# Patient Record
Sex: Male | Born: 1950 | Race: White | Hispanic: No | Smoking: Never smoker
Health system: Southern US, Community
[De-identification: ages and names within clinical notes are randomized; demographics above are authoritative.]

## PROBLEM LIST (undated history)

## (undated) HISTORY — PX: BACK SURGERY: SHX140

---

## 2018-09-22 ENCOUNTER — Emergency Department (HOSPITAL_BASED_OUTPATIENT_CLINIC_OR_DEPARTMENT_OTHER)
Admission: EM | Admit: 2018-09-22 | Discharge: 2018-09-22 | Disposition: A | Discharge status: Home / Self Care | Payer: BLUE CROSS/BLUE SHIELD | Attending: Emergency Medicine | Admitting: Emergency Medicine

## 2018-09-22 ENCOUNTER — Other Ambulatory Visit: Payer: Self-pay

## 2018-09-22 ENCOUNTER — Emergency Department (HOSPITAL_BASED_OUTPATIENT_CLINIC_OR_DEPARTMENT_OTHER): Payer: BLUE CROSS/BLUE SHIELD

## 2018-09-22 ENCOUNTER — Encounter (HOSPITAL_BASED_OUTPATIENT_CLINIC_OR_DEPARTMENT_OTHER): Payer: Self-pay | Admitting: Emergency Medicine

## 2018-09-22 DIAGNOSIS — S70351A Superficial foreign body, right thigh, initial encounter: Secondary | ICD-10-CM | POA: Diagnosis not present

## 2018-09-22 DIAGNOSIS — Y929 Unspecified place or not applicable: Secondary | ICD-10-CM | POA: Diagnosis not present

## 2018-09-22 DIAGNOSIS — Z5321 Procedure and treatment not carried out due to patient leaving prior to being seen by health care provider: Secondary | ICD-10-CM | POA: Diagnosis not present

## 2018-09-22 DIAGNOSIS — X58XXXA Exposure to other specified factors, initial encounter: Secondary | ICD-10-CM | POA: Insufficient documentation

## 2018-09-22 DIAGNOSIS — Y998 Other external cause status: Secondary | ICD-10-CM | POA: Diagnosis not present

## 2018-09-22 DIAGNOSIS — Y9389 Activity, other specified: Secondary | ICD-10-CM | POA: Diagnosis not present

## 2018-09-22 NOTE — ED Triage Notes (Signed)
Pt states a needle broke off in his R thigh today while giving himself his testosterone shot.

## 2018-09-22 NOTE — ED Notes (Signed)
Pt states he cannot stay any longer and is leaving the premises.

## 2019-05-14 IMAGING — DX DG FEMUR 2+V*R*
4 series · 5 of 5 positions shown · non-contrast
Comparison: None.

CLINICAL DATA: Right lower leg pain. Assess for foreign
body/needle.

EXAM:
RIGHT FEMUR 2 VIEWS

[femur ap (1 of 2)]
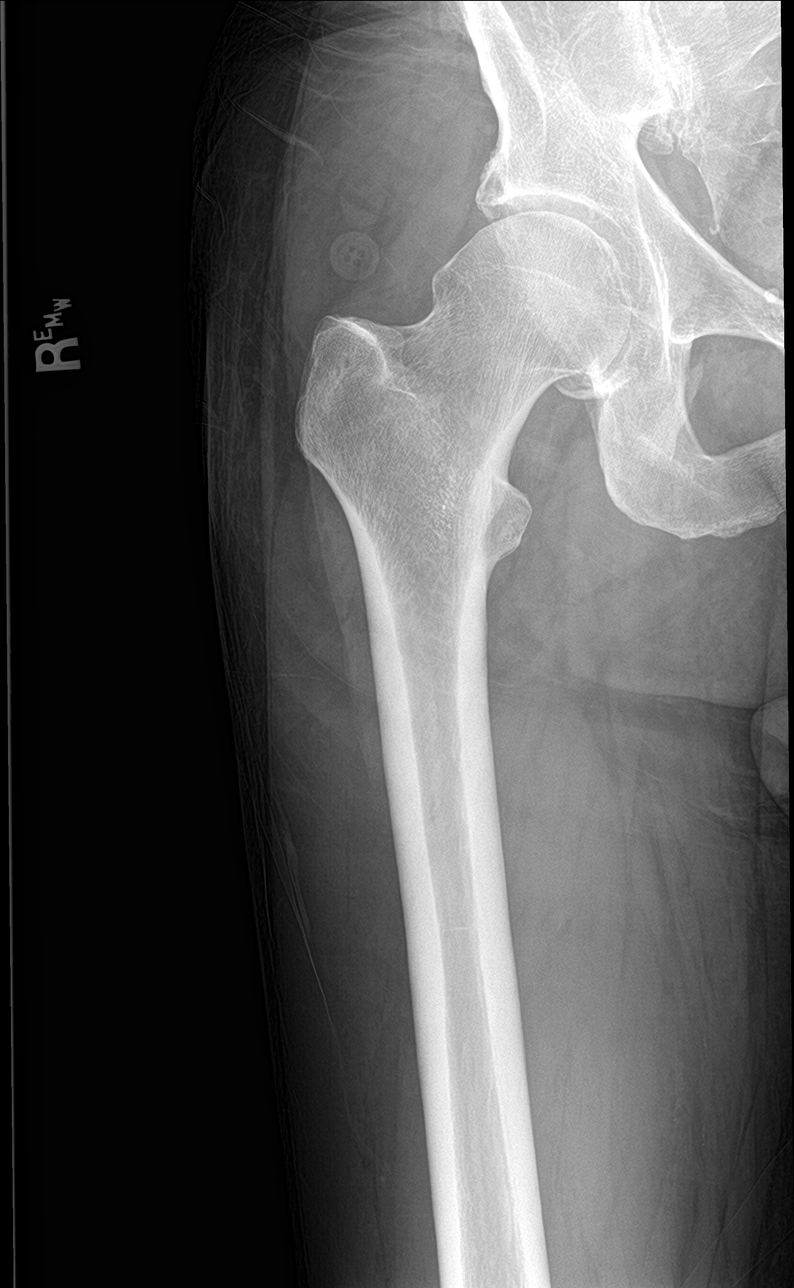

[femur ap (2 of 2)]
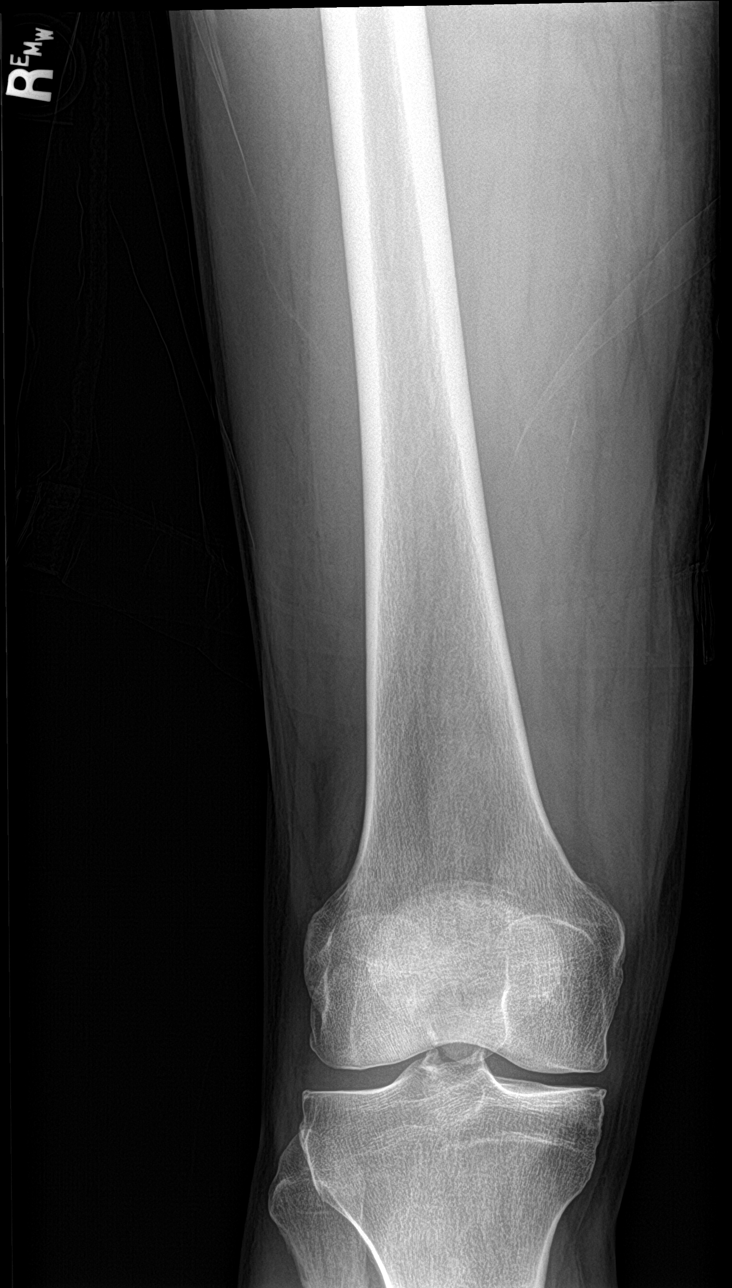

[Series 3: femur lat · 0.14mm/px · 2 of 2 slices shown (1 of 2)]
[im 1/2]
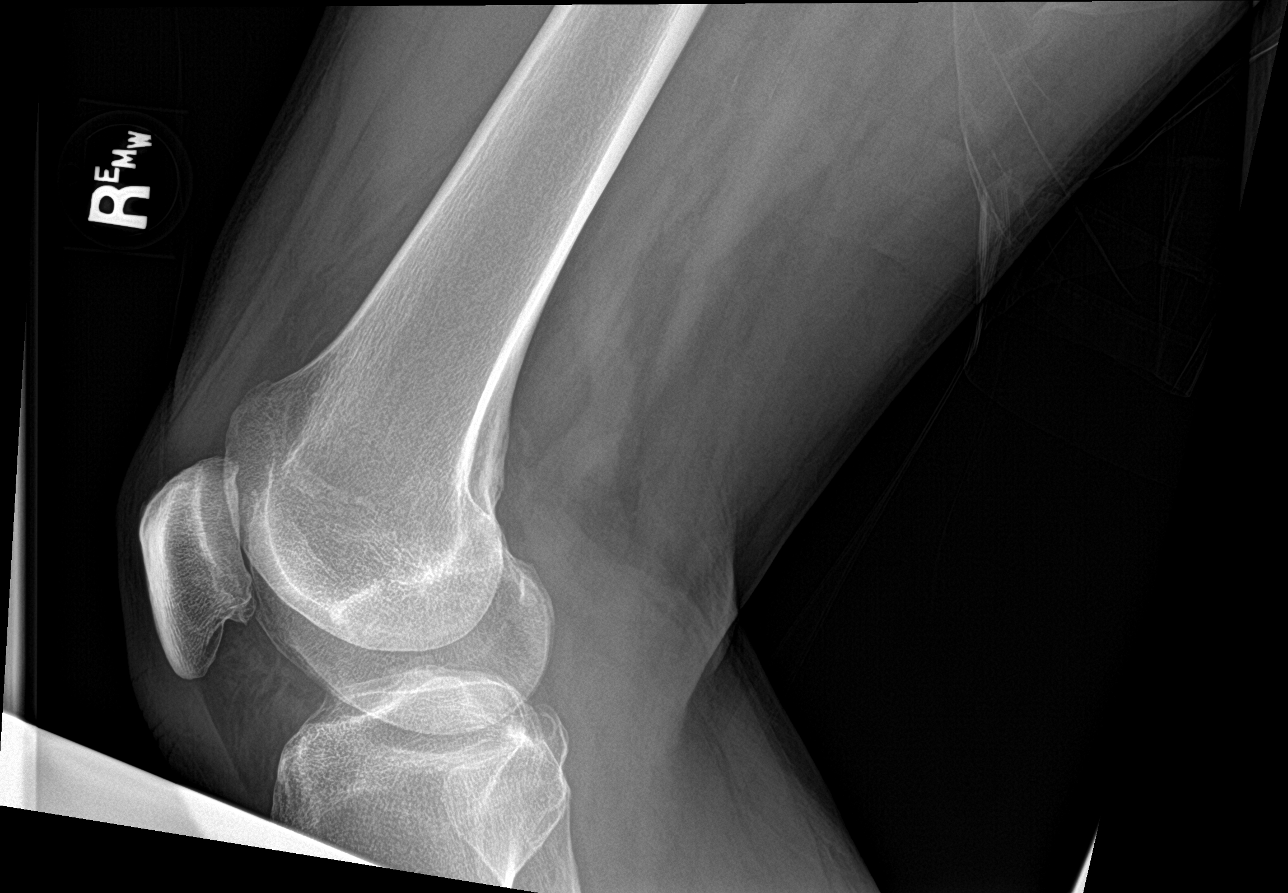
[im 2/2]
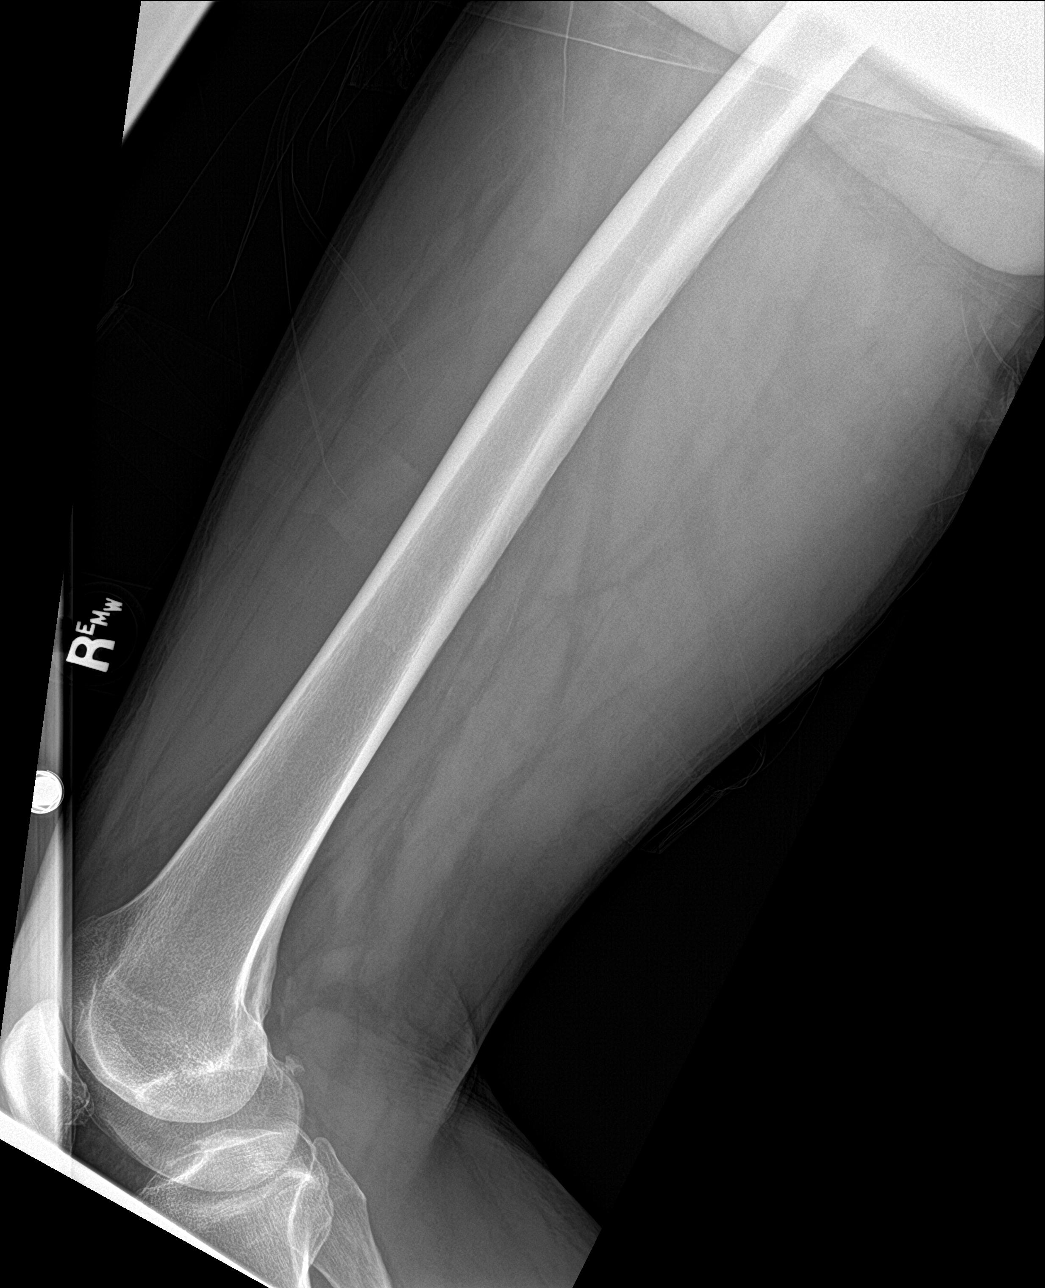

[femur lat (2 of 2)]
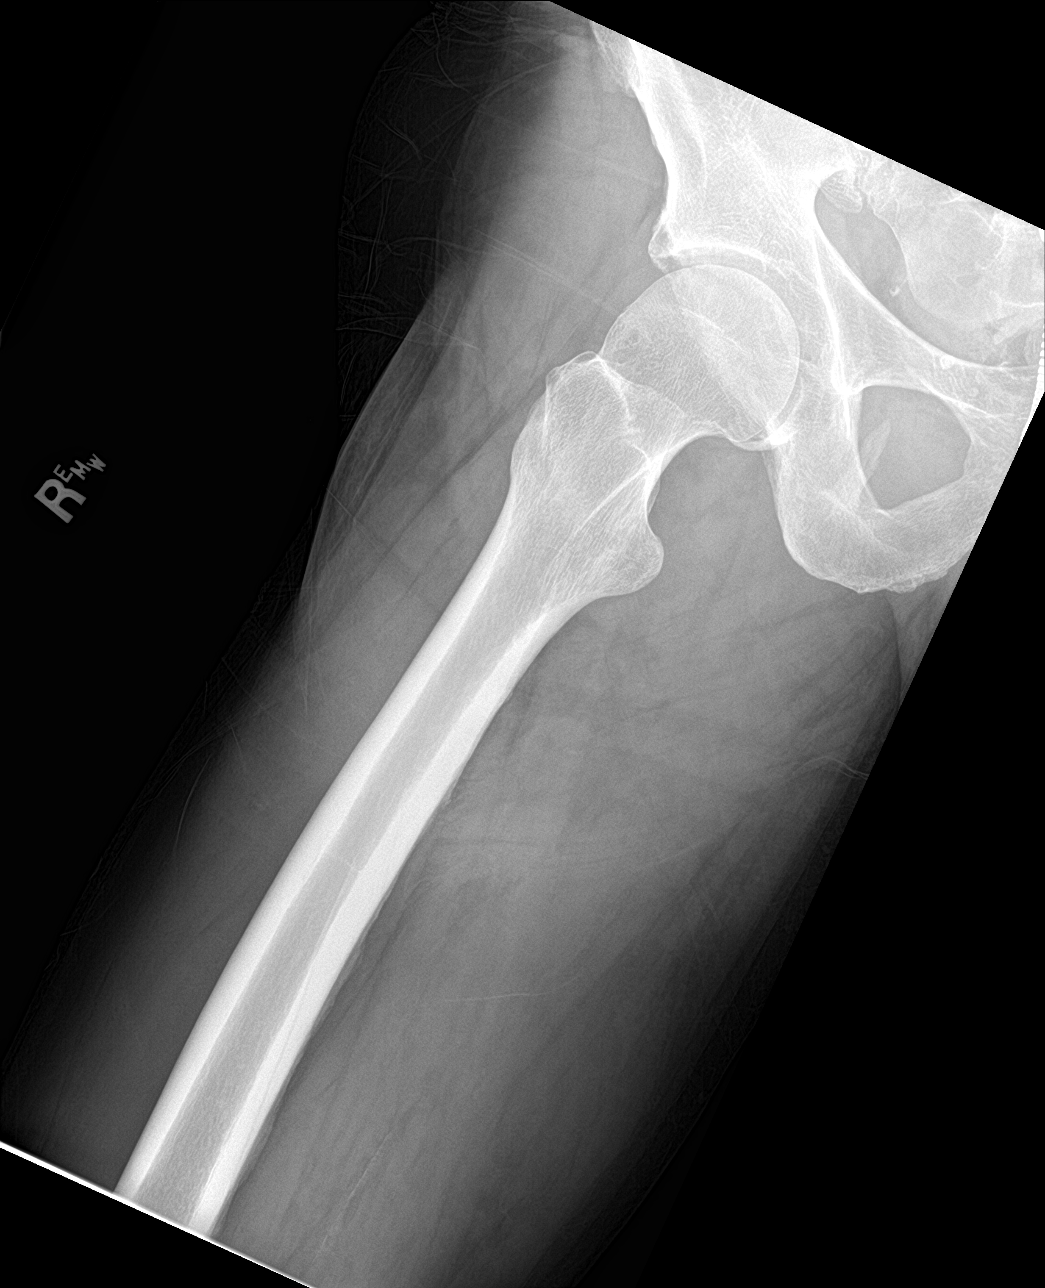

[5 of 5 positions shown; findings below may reference images not displayed]

FINDINGS: There is no evidence of fracture or dislocation. No radiopaque
needle is identified. Soft tissues are unremarkable.
IMPRESSION: No acute fracture or dislocation.  No radiopaque needle noted.
